# Patient Record
Sex: Female | Born: 1990 | Race: Black or African American | Hispanic: No | Marital: Single | Smoking: Never smoker
Health system: Southern US, Community
[De-identification: ages and names within clinical notes are randomized; demographics above are authoritative.]

## PROBLEM LIST (undated history)

## (undated) DIAGNOSIS — Z789 Other specified health status: Secondary | ICD-10-CM

## (undated) HISTORY — PX: ANKLE SURGERY: SHX546

---

## 2015-06-01 ENCOUNTER — Encounter (HOSPITAL_COMMUNITY): Payer: Self-pay | Admitting: Obstetrics and Gynecology

## 2015-06-20 ENCOUNTER — Encounter (HOSPITAL_COMMUNITY): Payer: Self-pay | Admitting: Obstetrics and Gynecology

## 2015-06-20 ENCOUNTER — Other Ambulatory Visit (HOSPITAL_COMMUNITY): Payer: Self-pay | Admitting: Obstetrics and Gynecology

## 2015-06-20 DIAGNOSIS — Z8669 Personal history of other diseases of the nervous system and sense organs: Secondary | ICD-10-CM

## 2015-06-20 DIAGNOSIS — Z3689 Encounter for other specified antenatal screening: Secondary | ICD-10-CM

## 2015-06-20 DIAGNOSIS — Z3A19 19 weeks gestation of pregnancy: Secondary | ICD-10-CM

## 2015-08-01 ENCOUNTER — Encounter (HOSPITAL_COMMUNITY): Payer: Self-pay | Admitting: Obstetrics and Gynecology

## 2015-08-09 ENCOUNTER — Encounter (HOSPITAL_COMMUNITY): Payer: Self-pay

## 2015-08-10 ENCOUNTER — Ambulatory Visit (HOSPITAL_COMMUNITY)
Admission: RE | Admit: 2015-08-10 | Discharge: 2015-08-10 | Disposition: A | Discharge status: Home / Self Care | Payer: Medicaid Other | Source: Ambulatory Visit | Attending: Obstetrics and Gynecology | Admitting: Obstetrics and Gynecology

## 2015-08-10 ENCOUNTER — Encounter (HOSPITAL_COMMUNITY): Payer: Self-pay

## 2015-08-10 DIAGNOSIS — Z3A19 19 weeks gestation of pregnancy: Secondary | ICD-10-CM

## 2015-08-10 DIAGNOSIS — O09892 Supervision of other high risk pregnancies, second trimester: Secondary | ICD-10-CM | POA: Insufficient documentation

## 2015-08-10 DIAGNOSIS — Z3689 Encounter for other specified antenatal screening: Secondary | ICD-10-CM

## 2015-08-10 DIAGNOSIS — Z36 Encounter for antenatal screening of mother: Secondary | ICD-10-CM | POA: Insufficient documentation

## 2015-08-10 DIAGNOSIS — Z8669 Personal history of other diseases of the nervous system and sense organs: Secondary | ICD-10-CM

## 2015-08-10 DIAGNOSIS — O09299 Supervision of pregnancy with other poor reproductive or obstetric history, unspecified trimester: Secondary | ICD-10-CM | POA: Insufficient documentation

## 2015-08-10 DIAGNOSIS — Q039 Congenital hydrocephalus, unspecified: Secondary | ICD-10-CM | POA: Insufficient documentation

## 2015-08-10 DIAGNOSIS — Z3A18 18 weeks gestation of pregnancy: Secondary | ICD-10-CM | POA: Insufficient documentation

## 2015-08-10 DIAGNOSIS — Q040 Congenital malformations of corpus callosum: Secondary | ICD-10-CM | POA: Diagnosis not present

## 2015-08-10 DIAGNOSIS — O352XX0 Maternal care for (suspected) hereditary disease in fetus, not applicable or unspecified: Secondary | ICD-10-CM

## 2015-08-10 HISTORY — DX: Other specified health status: Z78.9

## 2015-08-10 NOTE — Progress Notes (Signed)
Genetic Counseling  Visit Summary Note  Appointment Date: 08/10/2015 Referred By: Rutha Bouchard, MD  Date of Birth: 1990-04-23  Pregnancy history: [id number] Estimated Date of Delivery: 01/04/16 Estimated Gestational Age: [redacted]w[redacted]d I met with Elizabeth Walters genetic counseling because of a family history of hydrocephaly.  In summary:  Discussed family history of hydrocephalus  Reviewed possible recurrence risks  Discussed options of screening  Follow up ultrasound scheduled for further evaluation  Discussed general population carrier screening options - declined  CF  SMA  Hemoglobinopathies  We began by reviewing the family history in detail. Elizabeth Walters that she has an 861month old son who was diagnosed after birth as having hydrocephalus due to aqueductal stenosis.  It was not seen on her anatomy ultrasound.  She stated that he has agenesis of the corpus callosum and significant developmental delays (at 8 months he does not yet roll over) for which he is receiving therapies.  He has had a shunt and [facility]  Elizabeth Walters another son, from a previous relationship, who is developmentally appropriate at 24years of age.  She has several siblings, nieces and nephews and there is no history of hydrocephalus in other family members.  The father of the baby has 3 other children and there is no history of similar health concerns.  We discussed that hydrocephalus describes an abnormal accumulation of fluid (cerebrospinal fluid) in the brain.  This excess fluid results in abnormal widening of the ventricles of the brain which puts pressure on the brain tissue.  Different medical conditions can cause a blockage in the openings between the ventricles or a decrease in the normal reabsorption of the fluid resulting in an over-accumulation of fluid.  The resulting pressure of this fluid causes hydrocephalus.  She was counseled that congenital  hydrocephalus may be caused by events during fetal development or a genetic condition.  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  In some cases aqueductal stenosis is inherited as an X linked condition due to a mutation in L1CAM. At this time, Elizabeth Walters son has not had a formal genetics evaluation to determine if there is an underlying genetic etiology for his hydrocephalus. When X-linked aqueductal stenosis has not been ruled out there is a 5-10% recurrence chance for subsequent pregnancies.  If her son were to have X-linked aqueductal stenosis, the recurrence chance could be as great as 50% for this pregnancy.  Elizabeth Walters offered the option of a genetics evaluation for her son, to determine if a specific gene change may be responsible for his health concerns.  She understands that this may help to better define her recurrence chances.  She was interested in this option and we will work on making that referral for her.  As those results will likely not be available until much later in the pregnancy, a follow up ultrasound was scheduled to evaluate the fetal ventricles.  The family histories were otherwise found to be noncontributory for birth defects, mental retardation, and known genetic conditions. Without further information regarding the provided family history, an accurate genetic risk cannot be calculated. Further genetic counseling is warranted if more information is obtained.  Ms. GZiehmwas provided with written information regarding cystic fibrosis (CF), spinal muscular atrophy (SMA) and hemoglobinopathies including the carrier frequency, availability of carrier screening and prenatal diagnosis if indicated.  In addition, we discussed that CF and hemoglobinopathies are routinely screened for as  part of the [place] newborn screening panel.  After further discussion, she declined screening for CF, SMA and hemoglobinopathies.  Elizabeth Walters denied exposure to environmental  toxins or chemical agents. She denied the use of alcohol, tobacco or street drugs. She denied significant viral illnesses during the course of her pregnancy. Her medical and surgical histories were noncontributory.   I counseled Elizabeth Walters regarding the above risks and available options.  The approximate face-to-face time with the genetic counselor was 45 minutes.  Most of the counseling was provided by Volney Presser, [employer] genetic counseling student, under my direct supervision.   Cam Hai, MS Certified Genetic Counselor

## 2015-08-11 ENCOUNTER — Telehealth (HOSPITAL_COMMUNITY): Payer: Self-pay | Admitting: MS"

## 2015-08-11 ENCOUNTER — Other Ambulatory Visit (HOSPITAL_COMMUNITY): Payer: Self-pay

## 2015-08-11 DIAGNOSIS — O352XX0 Maternal care for (suspected) hereditary disease in fetus, not applicable or unspecified: Secondary | ICD-10-CM | POA: Insufficient documentation

## 2015-08-11 DIAGNOSIS — Z3A19 19 weeks gestation of pregnancy: Secondary | ICD-10-CM | POA: Insufficient documentation

## 2015-08-11 NOTE — Telephone Encounter (Signed)
Attempted to contact patient to provide [facility] [phone]) to provider to her pediatrician so that her son's pediatrician can refer him to medical genetics. Patient did not answer and voicemail was full. Unable to leave message.   Clydie BraunKaren Kumari Sculley 08/11/2015 3:45 PM

## 2015-08-11 NOTE — Telephone Encounter (Signed)
Spoke with patient and provided her with contact number for [facility] to give to her son's pediatrician in order for the pediatrician's office to facilitate referral for him.   Elizabeth BraunKaren Amisadai Woodford 08/11/2015 3:52 PM

## 2015-10-05 ENCOUNTER — Ambulatory Visit (HOSPITAL_COMMUNITY): Payer: Medicaid Other

## 2015-10-11 ENCOUNTER — Ambulatory Visit (HOSPITAL_COMMUNITY): Payer: Medicaid Other

## 2015-10-14 ENCOUNTER — Ambulatory Visit (HOSPITAL_COMMUNITY)
Admission: RE | Admit: 2015-10-14 | Discharge: 2015-10-14 | Disposition: A | Discharge status: Home / Self Care | Payer: Medicaid Other | Source: Ambulatory Visit | Attending: Obstetrics and Gynecology | Admitting: Obstetrics and Gynecology

## 2015-10-14 ENCOUNTER — Encounter (HOSPITAL_COMMUNITY): Payer: Self-pay

## 2015-10-14 DIAGNOSIS — Z3A28 28 weeks gestation of pregnancy: Secondary | ICD-10-CM | POA: Diagnosis not present

## 2015-10-14 DIAGNOSIS — O09293 Supervision of pregnancy with other poor reproductive or obstetric history, third trimester: Secondary | ICD-10-CM | POA: Diagnosis present

## 2015-10-14 DIAGNOSIS — Z8669 Personal history of other diseases of the nervous system and sense organs: Secondary | ICD-10-CM

## 2016-06-13 ENCOUNTER — Encounter (HOSPITAL_COMMUNITY): Payer: Self-pay

## 2017-08-25 IMAGING — US US MFM OB FOLLOW-UP
1 series · 14 of 28 positions shown · non-contrast
Comparison: none

[Series 1: us mfm ob follow-up · 14 of 58 slices shown]
[im 3/58]
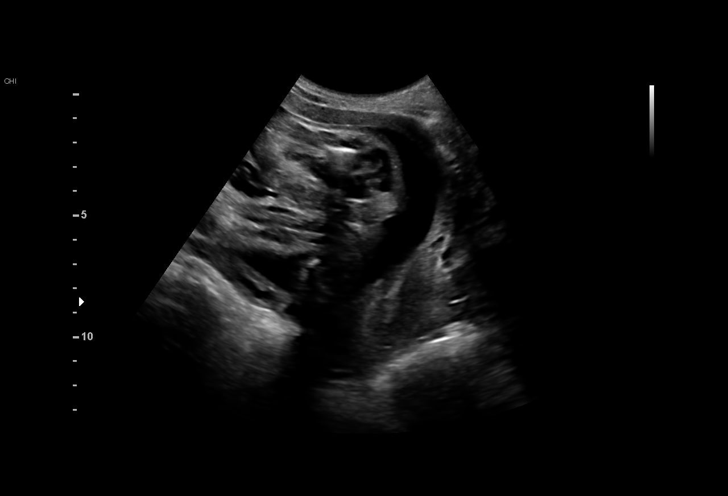
[im 7/58]
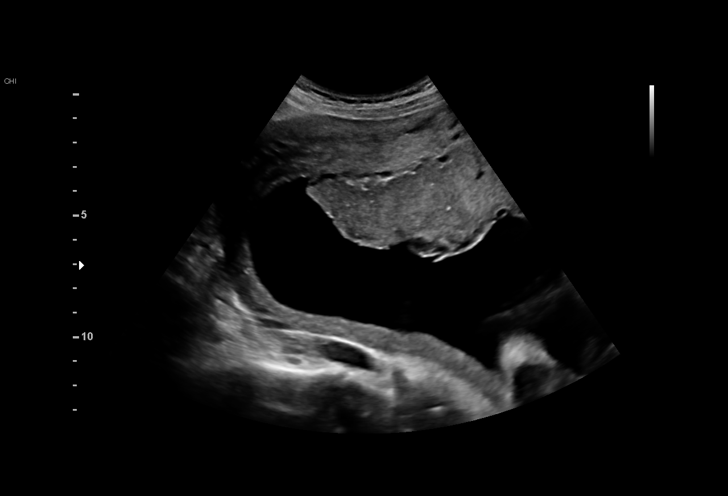
[im 11/58]
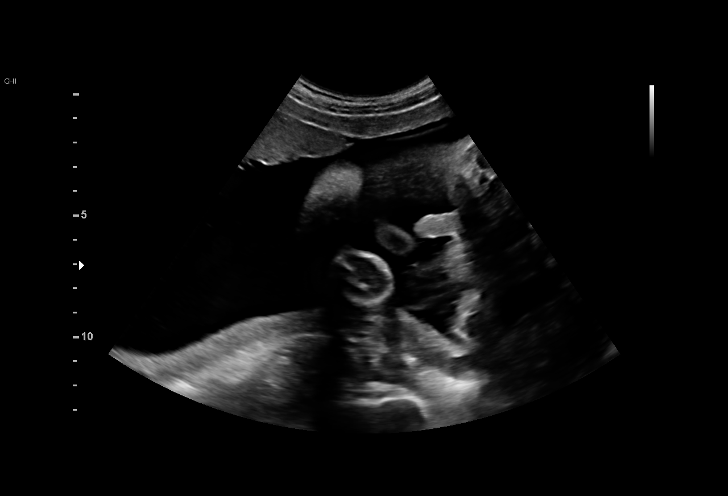
[im 15/58]
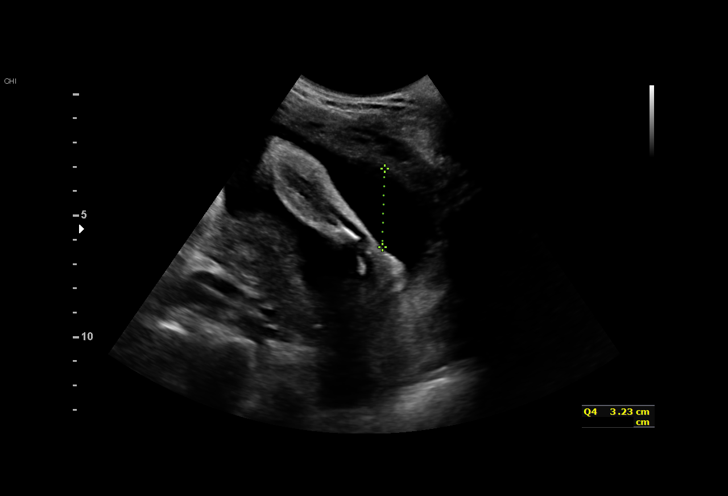
[im 20/58]
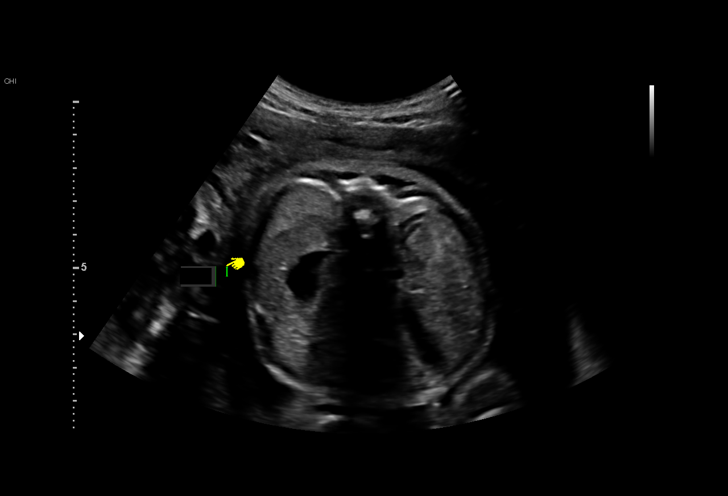
[im 24/58]
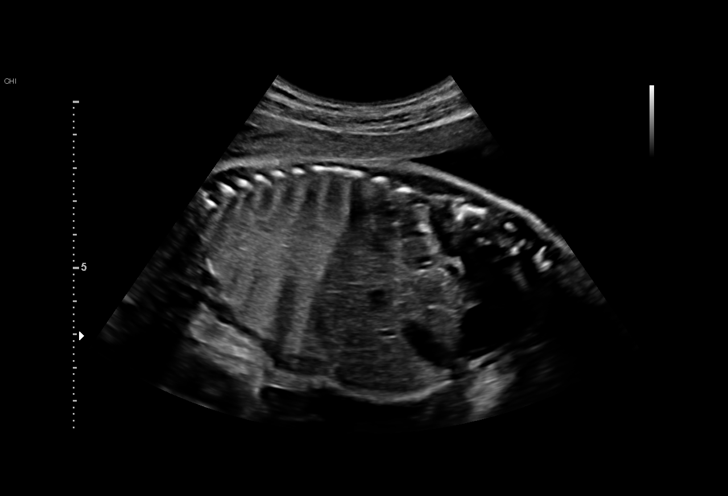
[im 28/58]
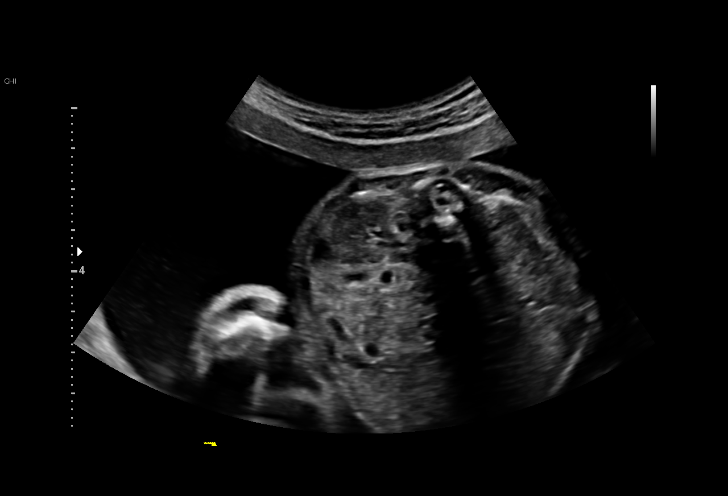
[im 32/58]
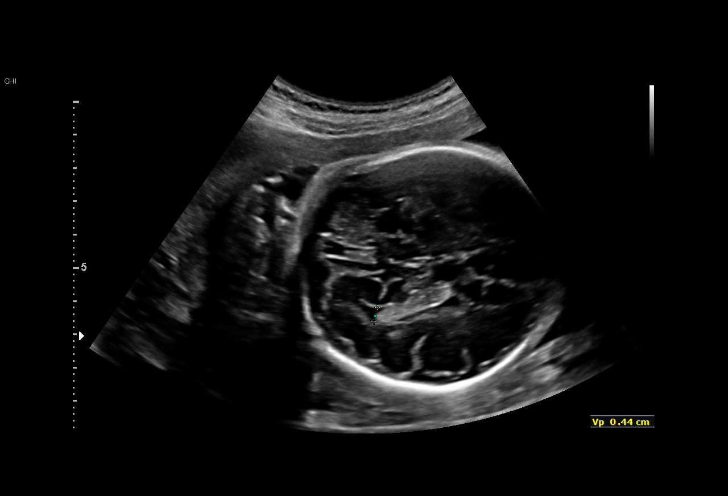
[im 36/58]
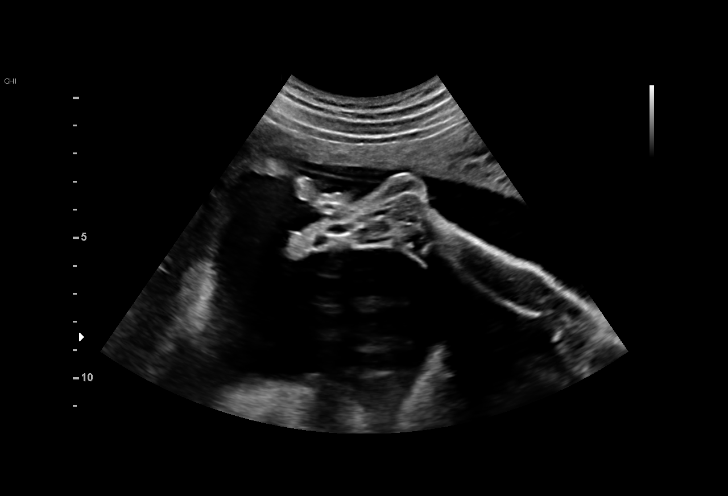
[im 41/58]
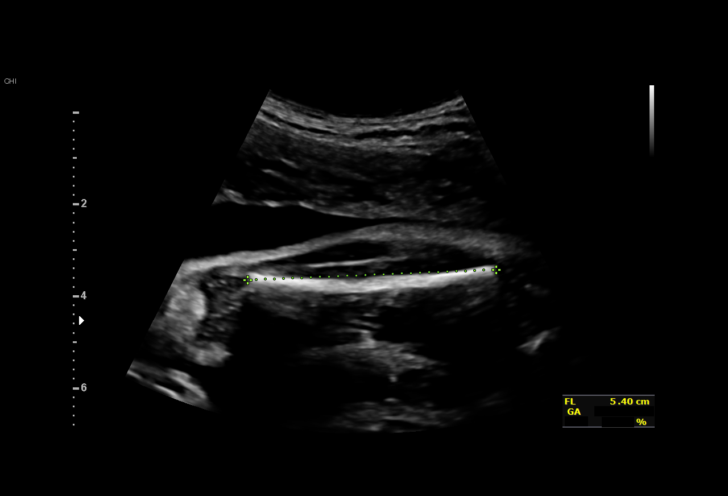
[im 45/58]
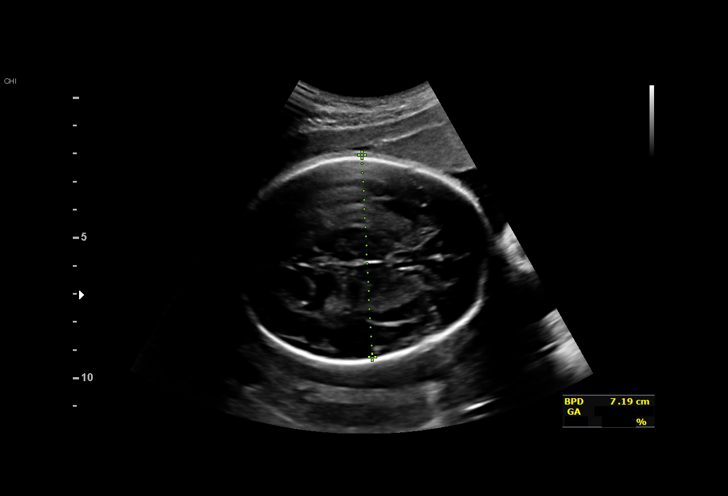
[im 49/58]
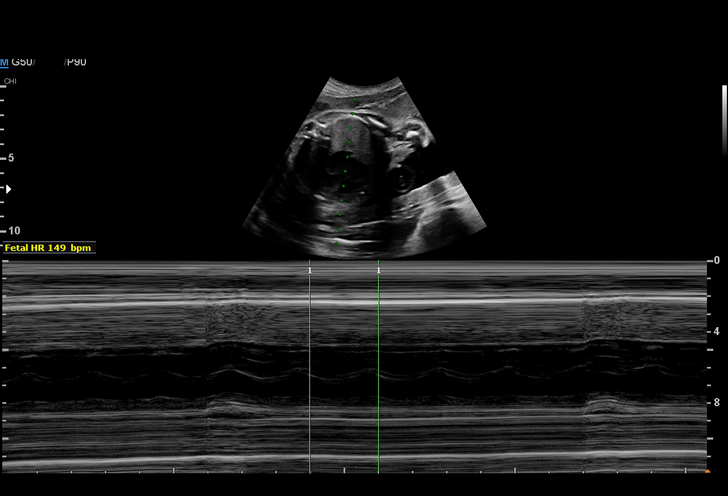
[im 53/58]
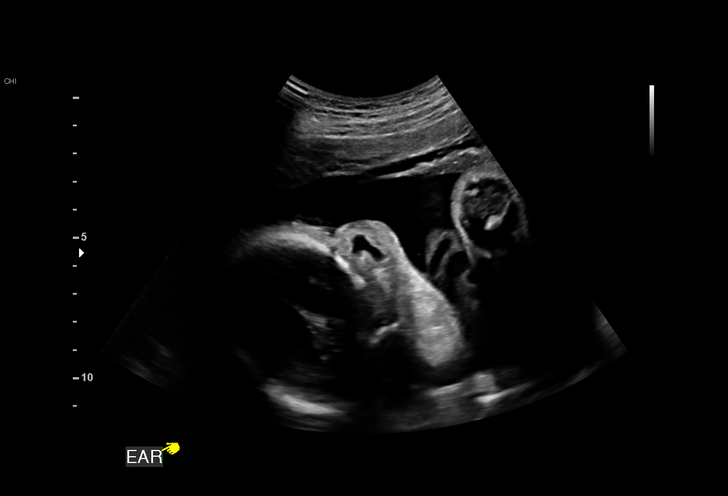
[im 58/58]
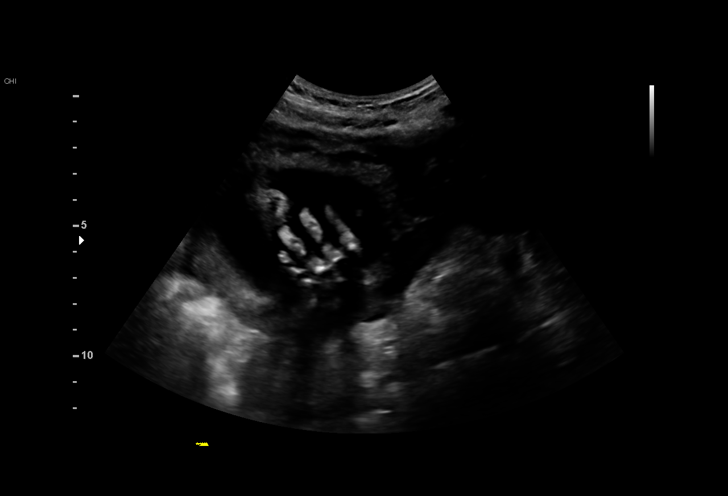

[14 of 28 positions shown; findings below may reference images not displayed]

[HOSPITAL],[HOSPITAL]

1  SHUHIN MEZAN            [id number]      [id number]     [id number]
Indications

28 weeks gestation of pregnancy
History of fetal abnormality in previous
pregnancy, currently pregnant
(Hydrocephalus)
Short interval between pregancies, 2nd
trimester
OB History

Gravidity:    3         Term:   2        Prem:   0        SAB:   0
TOP:          0       Ectopic:  0        Living: 2
Fetal Evaluation

Num Of Fetuses:     1
Fetal Heart         149
Rate(bpm):
Cardiac Activity:   Observed
Presentation:       Frank breech
Placenta:           Anterior, above cervical os
P. Cord Insertion:  Previously Visualized

Amniotic Fluid
AFI FV:      Subjectively within normal limits

AFI Sum(cm)     %Tile       Largest Pocket(cm)
17.04           63

RUQ(cm)       RLQ(cm)       LUQ(cm)        LLQ(cm)
5.03
Biometry

BPD:        72  mm     G. Age:  28w 6d         58  %    CI:        73.33   %   70 - 86
FL/HC:      20.2   %   18.8 -
HC:      267.2  mm     G. Age:  29w 1d         44  %    HC/AC:      1.14       1.05 -
AC:      234.2  mm     G. Age:  27w 5d         27  %    FL/BPD:     75.0   %   71 - 87
FL:         54  mm     G. Age:  28w 4d         44  %    FL/AC:      23.1   %   20 - 24

Est. FW:    5501  gm    2 lb 10 oz      50  %
Gestational Age

LMP:           28w 2d       Date:   03/30/15                 EDD:   01/04/16
U/S Today:     28w 4d                                        EDD:   01/02/16
Best:          28w 2d    Det. By:   LMP  (03/30/15)          EDD:   01/04/16
Anatomy

Cranium:               Appears normal         Aortic Arch:            Appears normal
Cavum:                 Appears normal         Ductal Arch:            Previously seen
Ventricles:            Appears normal         Diaphragm:              Appears normal
Choroid Plexus:        Appears normal         Stomach:                Appears normal, left
sided
Cerebellum:            Previously seen        Abdomen:                Appears normal
Posterior Fossa:       Previously seen        Abdominal Wall:         Previously seen
Nuchal Fold:           Previously seen        Cord Vessels:           Previously seen
Face:                  Orbits and profile     Kidneys:                Appear normal
previously seen
Lips:                  Previously seen        Bladder:                Appears normal
Thoracic:              Appears normal         Spine:                  Appears normal
Heart:                 Appears normal         Upper Extremities:      Appears normal
(4CH, axis, and situs
RVOT:                  Previously seen        Lower Extremities:      Appears normal
LVOT:                  Previously seen

Other:  Male gender. Heels and 5th digit previously visualized. Nasal bone
previously visualized.
Cervix Uterus Adnexa

Cervix
Length:           3.16  cm.
Normal appearance by transabdominal scan.

Uterus
No abnormality visualized.
Impression

Single IUP at 28w 2d
Hx of previous child with hydrocephalus, agenesis of the
corpus callosum- NIPT low risk for aneuploidy
Normal interval fetal anatomy - normal lateral ventricles and
CSP
Fetal growth is appropriate (50th %tile)
Anterior placenta without previa
Normal amniotic fluid volume
Recommendations

Follow-up ultrasounds as clinically indicated.

## 2017-12-13 ENCOUNTER — Other Ambulatory Visit: Payer: Self-pay

## 2017-12-13 ENCOUNTER — Emergency Department: Payer: Medicaid Other

## 2017-12-13 ENCOUNTER — Encounter: Payer: Self-pay | Admitting: Emergency Medicine

## 2017-12-13 ENCOUNTER — Emergency Department
Admission: EM | Admit: 2017-12-13 | Discharge: 2017-12-13 | Disposition: A | Discharge status: Home / Self Care | Payer: Medicaid Other | Attending: Emergency Medicine | Admitting: Emergency Medicine

## 2017-12-13 DIAGNOSIS — Z3A01 Less than 8 weeks gestation of pregnancy: Secondary | ICD-10-CM | POA: Diagnosis not present

## 2017-12-13 DIAGNOSIS — O200 Threatened abortion: Secondary | ICD-10-CM

## 2017-12-13 DIAGNOSIS — R102 Pelvic and perineal pain: Secondary | ICD-10-CM | POA: Diagnosis not present

## 2017-12-13 DIAGNOSIS — O26899 Other specified pregnancy related conditions, unspecified trimester: Secondary | ICD-10-CM

## 2017-12-13 DIAGNOSIS — O9989 Other specified diseases and conditions complicating pregnancy, childbirth and the puerperium: Secondary | ICD-10-CM | POA: Diagnosis present

## 2017-12-13 LAB — URINALYSIS, COMPLETE (UACMP) WITH MICROSCOPIC
BILIRUBIN URINE: NEGATIVE
GLUCOSE, UA: NEGATIVE mg/dL
HGB URINE DIPSTICK: NEGATIVE
KETONES UR: NEGATIVE mg/dL
LEUKOCYTES UA: NEGATIVE
NITRITE: NEGATIVE
PROTEIN: NEGATIVE mg/dL
Specific Gravity, Urine: 1.02 (ref 1.005–1.030)
pH: 7 (ref 5.0–8.0)

## 2017-12-13 LAB — CBC
HCT: 36.1 % (ref 36.0–46.0)
HEMOGLOBIN: 11.6 g/dL — AB (ref 12.0–15.0)
MCH: 28.5 pg (ref 26.0–34.0)
MCHC: 32.1 g/dL (ref 30.0–36.0)
MCV: 88.7 fL (ref 80.0–100.0)
NRBC: 0 % (ref 0.0–0.2)
Platelets: 282 10*3/uL (ref 150–400)
RBC: 4.07 MIL/uL (ref 3.87–5.11)
RDW: 13.5 % (ref 11.5–15.5)
WBC: 15.8 10*3/uL — AB (ref 4.0–10.5)

## 2017-12-13 LAB — COMPREHENSIVE METABOLIC PANEL
ALBUMIN: 3.7 g/dL (ref 3.5–5.0)
ALT: 15 U/L (ref 0–44)
ANION GAP: 5 (ref 5–15)
AST: 19 U/L (ref 15–41)
Alkaline Phosphatase: 48 U/L (ref 38–126)
BILIRUBIN TOTAL: 0.3 mg/dL (ref 0.3–1.2)
BUN: 10 mg/dL (ref 6–20)
CO2: 23 mmol/L (ref 22–32)
Calcium: 8.6 mg/dL — ABNORMAL LOW (ref 8.9–10.3)
Chloride: 110 mmol/L (ref 98–111)
Creatinine, Ser: 0.58 mg/dL (ref 0.44–1.00)
GFR calc Af Amer: >60 mL/min (ref >60)
GFR calc non Af Amer: >60 mL/min (ref >60)
Glucose, Bld: 99 mg/dL (ref 70–99)
POTASSIUM: 3.8 mmol/L (ref 3.5–5.1)
SODIUM: 138 mmol/L (ref 135–145)
TOTAL PROTEIN: 6.7 g/dL (ref 6.5–8.1)

## 2017-12-13 LAB — LIPASE, BLOOD: Lipase: 41 U/L (ref 11–51)

## 2017-12-13 LAB — HCG, QUANTITATIVE, PREGNANCY: hCG, Beta Chain, Quant, S: 52742 m[IU]/mL — ABNORMAL HIGH (ref <5)

## 2017-12-13 MED ORDER — ALUM & MAG HYDROXIDE-SIMETH 200-200-20 MG/5ML PO SUSP
30.0000 mL | Freq: Once | ORAL | Status: AC
  Administered 2017-12-13: 30 mL via ORAL
  Filled 2017-12-13 (×2): qty 30

## 2017-12-13 MED ORDER — ACETAMINOPHEN 500 MG PO TABS
1000.0000 mg | ORAL_TABLET | Freq: Once | ORAL | Status: AC
  Administered 2017-12-13: 1000 mg via ORAL
  Filled 2017-12-13: qty 2

## 2017-12-13 MED ORDER — LIDOCAINE VISCOUS HCL 2 % MT SOLN
15.0000 mL | Freq: Once | OROMUCOSAL | Status: AC
  Administered 2017-12-13: 15 mL via ORAL
  Filled 2017-12-13: qty 15

## 2017-12-13 NOTE — ED Notes (Signed)
Pt states that her stomach is burning and wants apple juice. EDP aware.

## 2017-12-13 NOTE — ED Provider Notes (Signed)
[facility] Emergency Department Provider Note       Time seen: ----------------------------------------- 5:54 PM on 12/13/2017 -----------------------------------------   I have reviewed the triage vital signs and the nursing notes.  HISTORY   Chief Complaint Abdominal Pain    HPI Elizabeth Walters is a 27 y.o. female with no significant past medical history who presents to the ED for lower abdominal cramping with spotting and sharp pain in the right side and flank region.  She denies fevers, chills, chest pain, shortness of breath, vomiting or diarrhea.  Patient states she is [redacted] weeks pregnant.  She has seen some spots when she urinates.  She is also having pink vaginal spotting.  Past Medical History:  Diagnosis Date  . Medical history non-contributory     Patient Active Problem List   Diagnosis Date Noted  . [redacted] weeks gestation of pregnancy   . Hereditary disease in family possibly affecting fetus, affecting management of mother, antepartum condition or complication     Past Surgical History:  Procedure Laterality Date  . ANKLE SURGERY      Allergies Patient has no known allergies.  Social History Social History   Tobacco Use  . Smoking status: Never Smoker  . Smokeless tobacco: Never Used  Substance Use Topics  . Alcohol use: No  . Drug use: No   Review of Systems Constitutional: Negative for fever. Cardiovascular: Negative for chest pain. Respiratory: Negative for shortness of breath. Gastrointestinal: Positive for abdominal pain Genitourinary: Positive for vaginal bleeding Musculoskeletal: Negative for back pain. Skin: Negative for rash. Neurological: Negative for headaches, focal weakness or numbness.  All systems negative/normal/unremarkable except as stated in the HPI  ____________________________________________   PHYSICAL EXAM:  VITAL SIGNS: ED Triage Vitals  Enc Vitals Group     BP 12/13/17 1730 110/63   Pulse Rate 12/13/17 1730 76     Resp 12/13/17 1730 16     Temp 12/13/17 1730 98.3 F (36.8 C)     Temp Source 12/13/17 1730 Oral     SpO2 12/13/17 1730 99 %     Weight 12/13/17 1732 137 lb (62.1 kg)     Height 12/13/17 1732 5\' 7"  (1.702 m)     Head Circumference --      Peak Flow --      Pain Score 12/13/17 1732 9     Pain Loc --      Pain Edu? --      Excl. in GC? --    Constitutional: Alert and oriented. Well appearing and in no distress. Eyes: Conjunctivae are normal. Normal extraocular movements. Cardiovascular: Normal rate, regular rhythm. No murmurs, rubs, or gallops. Respiratory: Normal respiratory effort without tachypnea nor retractions. Breath sounds are clear and equal bilaterally. No wheezes/rales/rhonchi. Gastrointestinal: No obvious tenderness is noted, normal bowel sounds. Musculoskeletal: Nontender with normal range of motion in extremities. No lower extremity tenderness nor edema. Neurologic:  Normal speech and language. No gross focal neurologic deficits are appreciated.  Skin:  Skin is warm, dry and intact. No rash noted. Psychiatric: Mood and affect are normal. Speech and behavior are normal.  ____________________________________________  ED COURSE:  As part of my medical decision making, I reviewed the following data within the electronic MEDICAL RECORD NUMBER History obtained from family if available, nursing notes, old chart and ekg, as well as notes from prior ED visits. Patient presented for abdominal pain and pregnancy, we will assess with labs and imaging as indicated at this time.   Procedures  ____________________________________________   LABS (pertinent positives/negatives)  Labs Reviewed  COMPREHENSIVE METABOLIC PANEL - Abnormal; Notable for the following components:      Result Value   Calcium 8.6 (*)    All other components within normal limits  CBC - Abnormal; Notable for the following components:   WBC 15.8 (*)    Hemoglobin 11.6 (*)    All  other components within normal limits  URINALYSIS, COMPLETE (UACMP) WITH MICROSCOPIC - Abnormal; Notable for the following components:   Color, Urine YELLOW (*)    APPearance CLEAR (*)    Bacteria, UA RARE (*)    All other components within normal limits  HCG, QUANTITATIVE, PREGNANCY - Abnormal; Notable for the following components:   hCG, Beta Chain, Quant, S [id number] (*)    All other components within normal limits  LIPASE, BLOOD    RADIOLOGY Images were viewed by me  Pregnancy ultrasound Does not reveal any acute abnormality ____________________________________________  DIFFERENTIAL DIAGNOSIS   UTI, ectopic, threatened miscarriage, occult infection, appendicitis  FINAL ASSESSMENT AND PLAN  Threatened miscarriage.   Plan: The patient had presented for abdominal pain in early pregnancy. Patient's labs were unremarkable other than mild leukocytosis of uncertain significance. Patient's imaging was reassuring.  She will be encouraged to follow-up as soon as possible with her OB/GYN doctor.   Ulice DashJohnathan E Kayde Warehime, MD   Note: This note was generated in part or whole with voice recognition software. Voice recognition is usually quite accurate but there are transcription errors that can and very often do occur. I apologize for any typographical errors that were not detected and corrected.     Emily FilbertWilliams, Shikara Mcauliffe E, MD 12/13/17 2029

## 2017-12-13 NOTE — ED Triage Notes (Signed)
Pt to ED from home c/o lower mid abd pain that started today and lower back and right side.  Denies n/v/d.  Pt is [redacted] weeks pregnant.  States burning with urination and sometimes pressure to go.  A&Ox4, speaking in complete and coherent sentences, in NAD in triage at this time.

## 2017-12-13 NOTE — ED Notes (Signed)
Pt c/o lower abdominal cramping, intermittent spotting, and sharp pain down her R side. Pt denies fever, chills, cough, diarrhea. Pt states she is [redacted] weeks pregnant at this time. Pt is alert and oriented at this time, NAD noted, c/o worsening pain with ambulation.

## 2017-12-13 NOTE — ED Notes (Signed)
FIRST NURSE NOTE:  Pt states she is approximately [redacted] weeks pregnant having abd pain and some bleeding.

## 2018-01-05 ENCOUNTER — Other Ambulatory Visit: Payer: Self-pay

## 2018-01-05 ENCOUNTER — Encounter: Payer: Self-pay | Admitting: *Deleted

## 2018-01-05 ENCOUNTER — Emergency Department (INDEPENDENT_AMBULATORY_CARE_PROVIDER_SITE_OTHER)
Admission: EM | Admit: 2018-01-05 | Discharge: 2018-01-05 | Disposition: A | Discharge status: Home / Self Care | Payer: Medicaid Other | Source: Home / Self Care | Attending: Family Medicine | Admitting: Family Medicine

## 2018-01-05 DIAGNOSIS — R300 Dysuria: Secondary | ICD-10-CM | POA: Diagnosis not present

## 2018-01-05 LAB — POCT URINALYSIS DIP (MANUAL ENTRY)
BILIRUBIN UA: NEGATIVE
BILIRUBIN UA: NEGATIVE mg/dL
Blood, UA: NEGATIVE
GLUCOSE UA: NEGATIVE mg/dL
Leukocytes, UA: NEGATIVE
Nitrite, UA: NEGATIVE
Protein Ur, POC: NEGATIVE mg/dL
SPEC GRAV UA: 1.025 (ref 1.010–1.025)
Urobilinogen, UA: 0.2 E.U./dL
pH, UA: 7 (ref 5.0–8.0)

## 2018-01-05 NOTE — Discharge Instructions (Addendum)
Increase fluid intake. °If symptoms become significantly worse during the night or over the weekend, proceed to the local emergency room.  °

## 2018-01-05 NOTE — ED Triage Notes (Signed)
Patient c/o 2 weeks of urinary frequency and some pain after urinating. She is currently [redacted] weeks gestation. No otc meds used.

## 2018-01-05 NOTE — ED Provider Notes (Signed)
[facility]    CSN: [id number] Arrival date & time: 01/05/18  1538     History   Chief Complaint Chief Complaint  Patient presents with  . Urinary Frequency    [redacted] weeks pregnant    HPI Elizabeth Walters is a 27 y.o. female.   Patient complains of two week history of urinary frequency, and two days ago developed mild dysuria.  She is [redacted] weeks gestation.  She denies fever or abdominal pain.  No vaginal bleeding.  The history is provided by the patient and the spouse.  Dysuria  Pain quality:  Burning Pain severity:  Mild Onset quality:  Sudden Duration:  2 days Timing:  Constant Progression:  Unchanged Chronicity:  New Recent urinary tract infections: no   Relieved by:  None tried Worsened by:  Nothing Ineffective treatments:  None tried Urinary symptoms: frequent urination   Urinary symptoms: no discolored urine, no foul-smelling urine, no hematuria, no hesitancy and no bladder incontinence   Associated symptoms: no abdominal pain, no fever, no flank pain, no nausea, no vaginal discharge and no vomiting   Risk factors comment:  Pregnancy   Past Medical History:  Diagnosis Date  . Medical history non-contributory     Patient Active Problem List   Diagnosis Date Noted  . [redacted] weeks gestation of pregnancy   . Hereditary disease in family possibly affecting fetus, affecting management of mother, antepartum condition or complication     Past Surgical History:  Procedure Laterality Date  . ANKLE SURGERY      OB History    Gravida  4   Para  2   Term  2   Preterm      AB      Living  2     SAB      TAB      Ectopic      Multiple      Live Births               Home Medications    Prior to Admission medications   Medication Sig Start Date End Date Taking? Authorizing Provider  Acetaminophen (TYLENOL) 325 MG CAPS Take by mouth.    [provider]  Ondansetron HCl (ZOFRAN PO) Take by mouth.    [provider]    Prenatal Vit-Fe Fumarate-FA (PRENATAL VITAMIN PO) Take by mouth.    [provider]    Family History Family History  Problem Relation Age of Onset  . Hypertension Other     Social History Social History   Tobacco Use  . Smoking status: Never Smoker  . Smokeless tobacco: Never Used  Substance Use Topics  . Alcohol use: No  . Drug use: No     Allergies   Patient has no known allergies.   Review of Systems Review of Systems  Constitutional: Negative for fever.  Gastrointestinal: Negative for abdominal pain, nausea and vomiting.  Genitourinary: Positive for dysuria. Negative for flank pain and vaginal discharge.     Physical Exam Triage Vital Signs ED Triage Vitals  Enc Vitals Group     BP 01/05/18 1710 114/80     Pulse Rate 01/05/18 1710 91     Resp 01/05/18 1710 16     Temp 01/05/18 1710 98.7 F (37.1 C)     Temp Source 01/05/18 1710 Oral     SpO2 01/05/18 1710 96 %     Weight 01/05/18 1711 141 lb (64 kg)     Height --  Head Circumference --      Peak Flow --      Pain Score 01/05/18 1711 0     Pain Loc --      Pain Edu? --      Excl. in GC? --    No data found.  Updated Vital Signs BP 114/80 (BP Location: Left Arm)   Pulse 91   Temp 98.7 F (37.1 C) (Oral)   Resp 16   Wt 64 kg   SpO2 96%   BMI 22.08 kg/m   Visual Acuity Right Eye Distance:   Left Eye Distance:   Bilateral Distance:    Right Eye Near:   Left Eye Near:    Bilateral Near:     Physical Exam Nursing notes and Vital Signs reviewed. Appearance:  Patient appears stated age, and in no acute distress.    Eyes:  Pupils are equal, round, and reactive to light and accomodation.  Extraocular movement is intact.  Conjunctivae are not inflamed   Pharynx:  Normal; moist mucous membranes  Neck:  Supple.  No adenopathy Lungs:  Clear to auscultation.  Breath sounds are equal.  Moving air well. Heart:  Regular rate and rhythm without murmurs, rubs, or gallops.  Abdomen:   Nontender without masses or hepatosplenomegaly.  Bowel sounds are present.  No CVA or flank tenderness.  Extremities:  No edema.  Skin:  No rash present.     UC Treatments / Results  Labs (all labs ordered are listed, but only abnormal results are displayed) Labs Reviewed  URINE CULTURE  POCT URINALYSIS DIP (MANUAL ENTRY) negative    EKG None  Radiology No results found.  Procedures Procedures (including critical care time)  Medications Ordered in UC Medications - No data to display  Initial Impression / Assessment and Plan / UC Course  I have reviewed the triage vital signs and the nursing notes.  Pertinent labs & imaging results that were available during my care of the patient were reviewed by me and considered in my medical decision making (see chart for details).    Normal urinalysis reassuring.  Urine culture pending (begin antibiotic if positive) Followup with OB.   Final Clinical Impressions(s) / UC Diagnoses   Final diagnoses:  Dysuria     Discharge Instructions     Increase fluid intake.  If symptoms become significantly worse during the night or over the weekend, proceed to the local emergency room.    ED Prescriptions    None        Lattie Haw, MD 01/07/18 1450

## 2018-01-06 LAB — URINE CULTURE
MICRO NUMBER:: 91469946
SPECIMEN QUALITY:: ADEQUATE

## 2018-01-07 ENCOUNTER — Telehealth: Payer: Self-pay

## 2018-01-07 NOTE — Telephone Encounter (Signed)
Spoke with patient, gave lab results, will follow up as needed. 

## 2019-10-25 IMAGING — US US RENAL
1 series · 14 of 25 positions shown · non-contrast
Comparison: None.

CLINICAL DATA: 27 y/o  F; flank pain.

EXAM:
RENAL / URINARY TRACT ULTRASOUND COMPLETE

[Series 1: us renal · 14 of 31 slices shown]
[im 1/31]
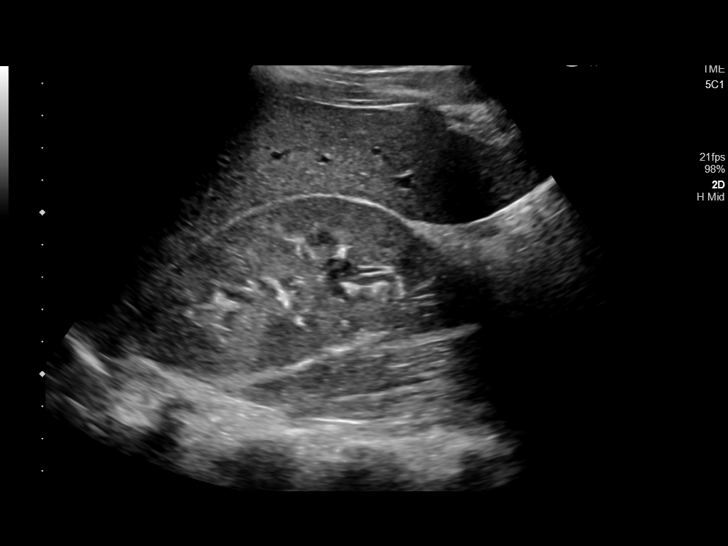
[im 3/31]
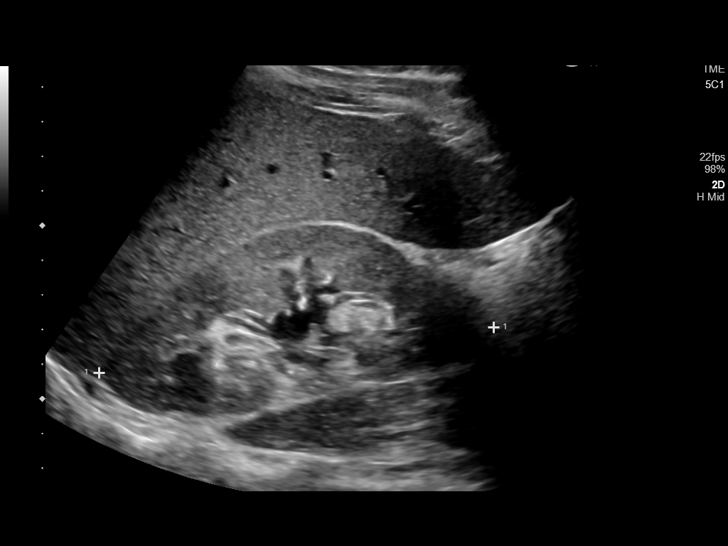
[im 6/31]
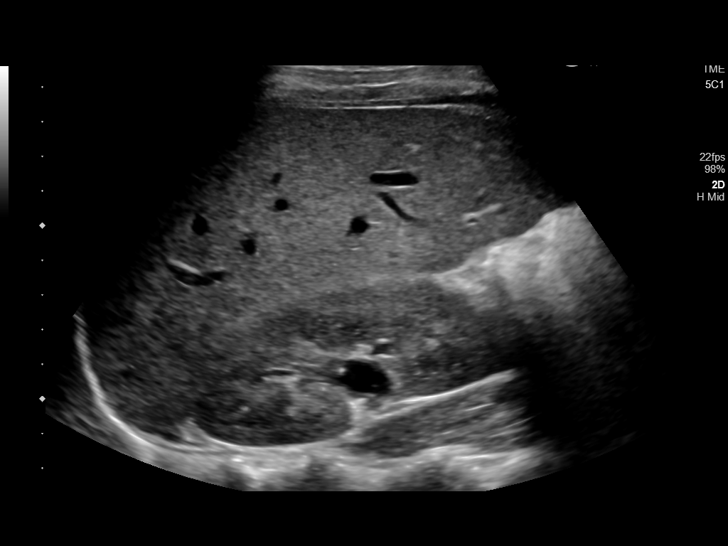
[im 8/31]
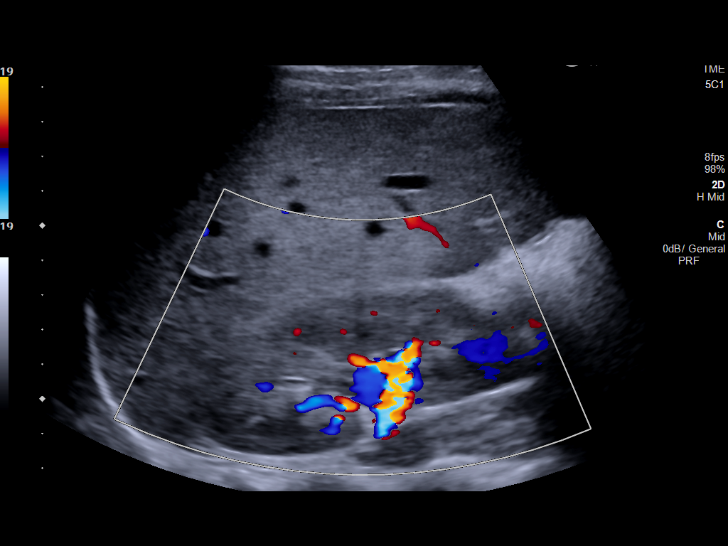
[im 11/31]
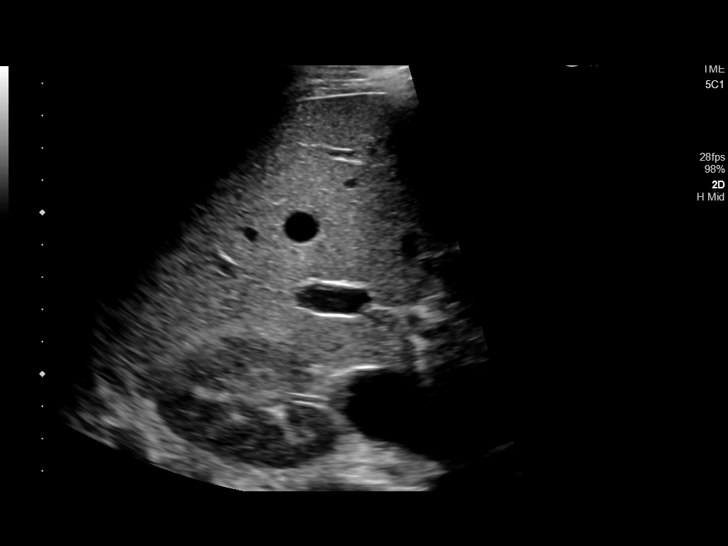
[im 12/31]
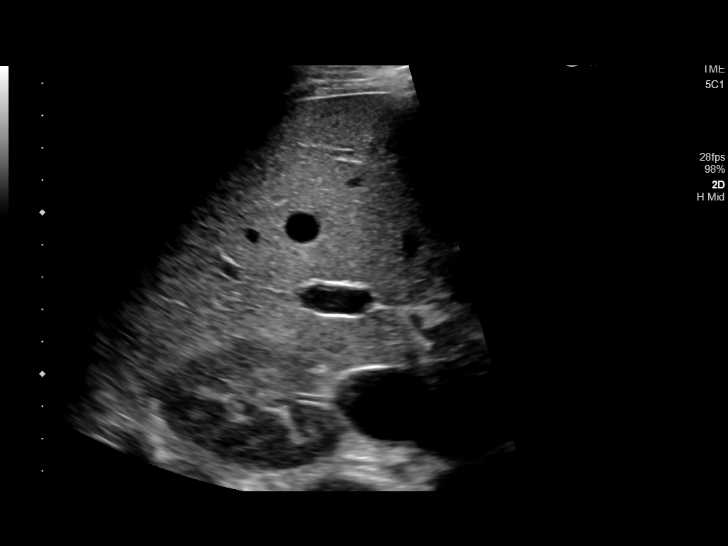
[im 14/31]
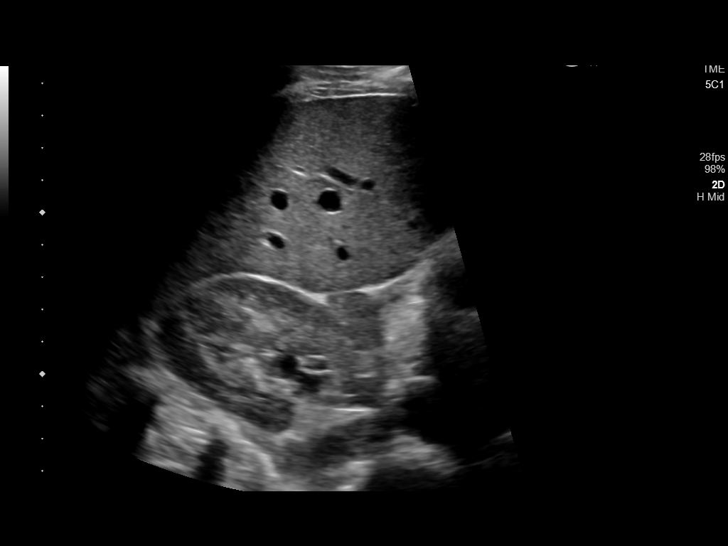
[im 17/31]
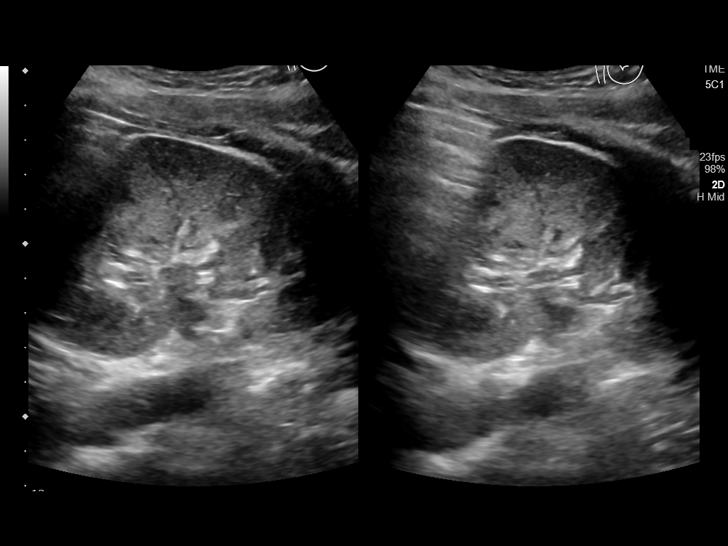
[im 19/31]
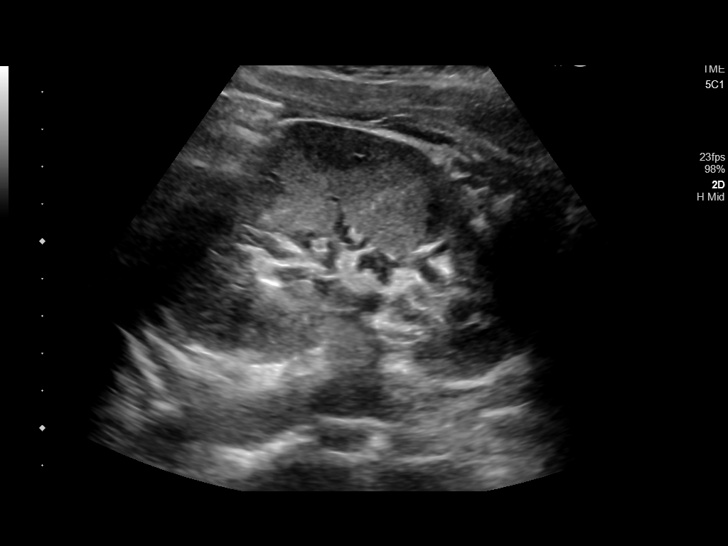
[im 21/31]
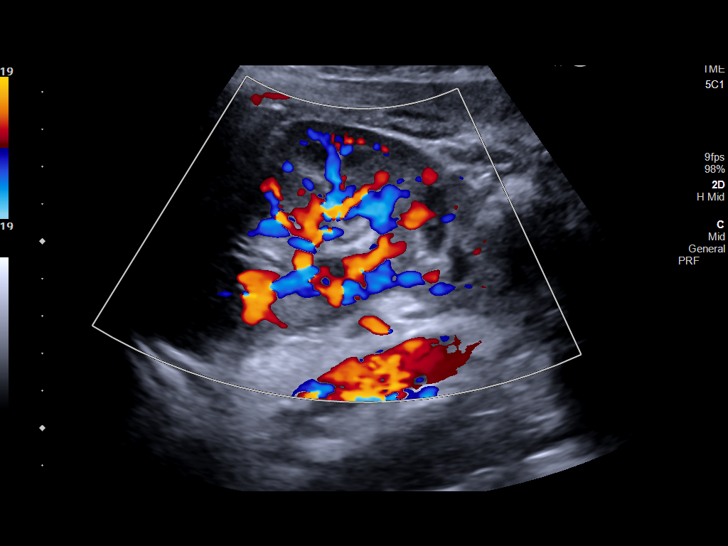
[im 23/31]
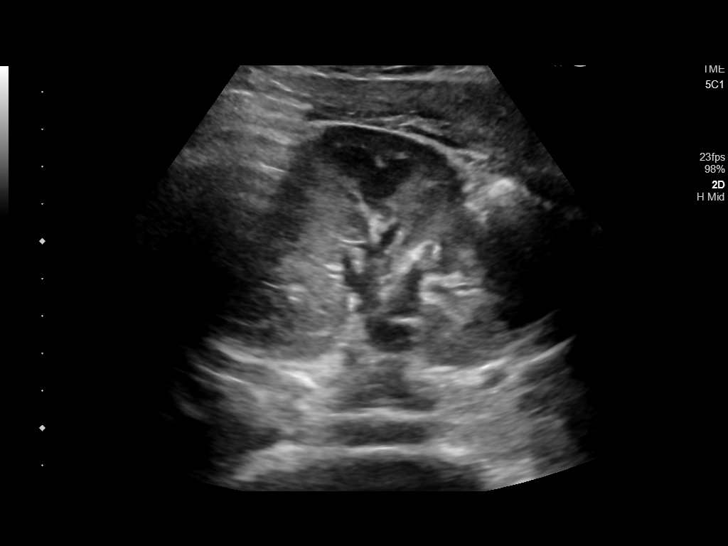
[im 26/31]
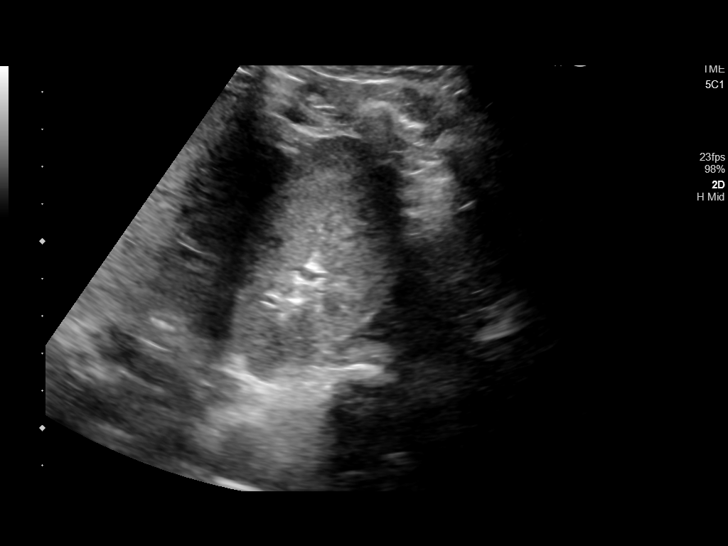
[im 28/31]
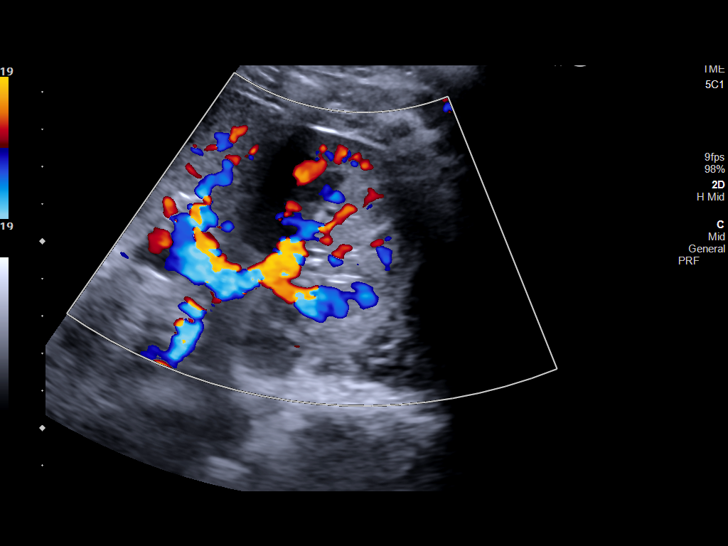
[im 31/31]
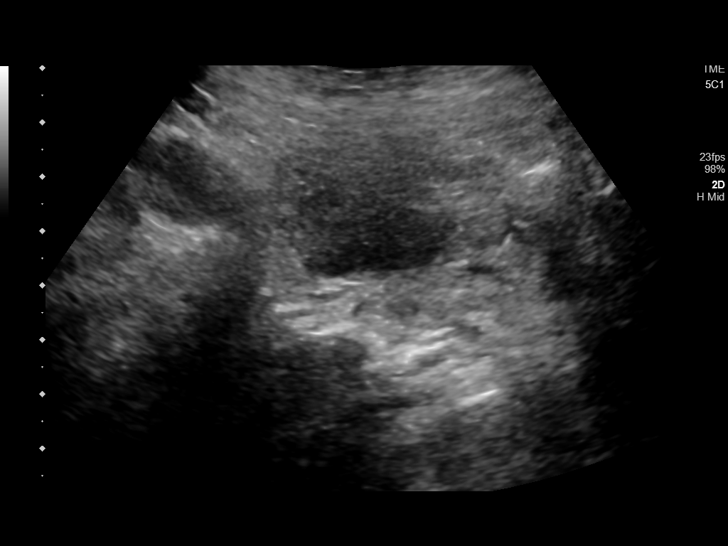

[14 of 25 positions shown; findings below may reference images not displayed]

FINDINGS: Right Kidney:

Renal measurements: 11.5 x 4.7 x 5.1 cm = volume: 143 mL .
Echogenicity within normal limits. No mass or hydronephrosis
visualized.

Left Kidney:

Renal measurements: 10.7 x 6.2 x 4.8 cm = volume: 166 mL.
Echogenicity within normal limits. No mass or hydronephrosis
visualized.

Bladder:

Appears normal for degree of bladder distention.
IMPRESSION: Normal renal ultrasound.
